# Patient Record
Sex: Male | Born: 1950 | Race: White | Hispanic: No | Marital: Married
Health system: Southern US, Community
[De-identification: ages and names within clinical notes are randomized; demographics above are authoritative.]

---

## 2003-10-23 ENCOUNTER — Ambulatory Visit (HOSPITAL_COMMUNITY): Admission: RE | Admit: 2003-10-23 | Discharge: 2003-10-23 | Payer: Self-pay | Admitting: Family Medicine

## 2006-03-18 ENCOUNTER — Ambulatory Visit (HOSPITAL_COMMUNITY): Admission: RE | Admit: 2006-03-18 | Discharge: 2006-03-18 | Payer: Self-pay | Admitting: Family Medicine

## 2006-04-21 ENCOUNTER — Encounter: Admission: RE | Admit: 2006-04-21 | Discharge: 2006-04-21 | Payer: Self-pay | Admitting: Family Medicine

## 2007-08-06 ENCOUNTER — Ambulatory Visit: Payer: Self-pay | Admitting: Critical Care Medicine

## 2007-08-06 DIAGNOSIS — J449 Chronic obstructive pulmonary disease, unspecified: Secondary | ICD-10-CM

## 2007-08-06 DIAGNOSIS — J309 Allergic rhinitis, unspecified: Secondary | ICD-10-CM | POA: Insufficient documentation

## 2007-08-06 DIAGNOSIS — J45909 Unspecified asthma, uncomplicated: Secondary | ICD-10-CM | POA: Insufficient documentation

## 2007-08-12 ENCOUNTER — Ambulatory Visit: Payer: Self-pay | Admitting: Cardiology

## 2007-08-13 ENCOUNTER — Ambulatory Visit: Payer: Self-pay | Admitting: Critical Care Medicine

## 2007-08-16 ENCOUNTER — Encounter: Payer: Self-pay | Admitting: Critical Care Medicine

## 2007-09-07 ENCOUNTER — Ambulatory Visit: Payer: Self-pay | Admitting: Critical Care Medicine

## 2007-11-29 ENCOUNTER — Ambulatory Visit: Payer: Self-pay | Admitting: Critical Care Medicine

## 2007-11-29 DIAGNOSIS — K219 Gastro-esophageal reflux disease without esophagitis: Secondary | ICD-10-CM

## 2008-01-10 ENCOUNTER — Ambulatory Visit (HOSPITAL_COMMUNITY): Admission: RE | Admit: 2008-01-10 | Discharge: 2008-01-10 | Payer: Self-pay | Admitting: Family Medicine

## 2008-02-29 ENCOUNTER — Ambulatory Visit (HOSPITAL_COMMUNITY): Admission: RE | Admit: 2008-02-29 | Discharge: 2008-02-29 | Payer: Self-pay | Admitting: Family Medicine

## 2008-05-30 ENCOUNTER — Ambulatory Visit (HOSPITAL_COMMUNITY): Admission: RE | Admit: 2008-05-30 | Discharge: 2008-05-30 | Payer: Self-pay | Admitting: General Surgery

## 2008-05-30 ENCOUNTER — Encounter (INDEPENDENT_AMBULATORY_CARE_PROVIDER_SITE_OTHER): Payer: Self-pay | Admitting: General Surgery

## 2008-06-25 ENCOUNTER — Emergency Department (HOSPITAL_COMMUNITY): Admission: EM | Admit: 2008-06-25 | Discharge: 2008-06-25 | Payer: Self-pay | Admitting: Emergency Medicine

## 2008-07-01 ENCOUNTER — Inpatient Hospital Stay (HOSPITAL_COMMUNITY): Admission: EM | Admit: 2008-07-01 | Discharge: 2008-07-04 | Payer: Self-pay | Admitting: Emergency Medicine

## 2008-07-18 ENCOUNTER — Inpatient Hospital Stay (HOSPITAL_COMMUNITY): Admission: RE | Admit: 2008-07-18 | Discharge: 2008-07-20 | Payer: Self-pay | Admitting: Orthopaedic Surgery

## 2008-09-05 ENCOUNTER — Encounter (HOSPITAL_COMMUNITY): Admission: RE | Admit: 2008-09-05 | Discharge: 2008-10-05 | Payer: Self-pay | Admitting: Orthopaedic Surgery

## 2008-10-04 ENCOUNTER — Ambulatory Visit (HOSPITAL_COMMUNITY): Admission: RE | Admit: 2008-10-04 | Discharge: 2008-10-04 | Payer: Self-pay | Admitting: Orthopaedic Surgery

## 2009-05-02 IMAGING — CR DG CHEST 1V PORT
1 series · 1 of 1 positions shown · non-contrast
Comparison: 03/18/2006

CLINICAL DATA: Line placement

PORTABLE CHEST - 1 VIEW

[view not recorded]
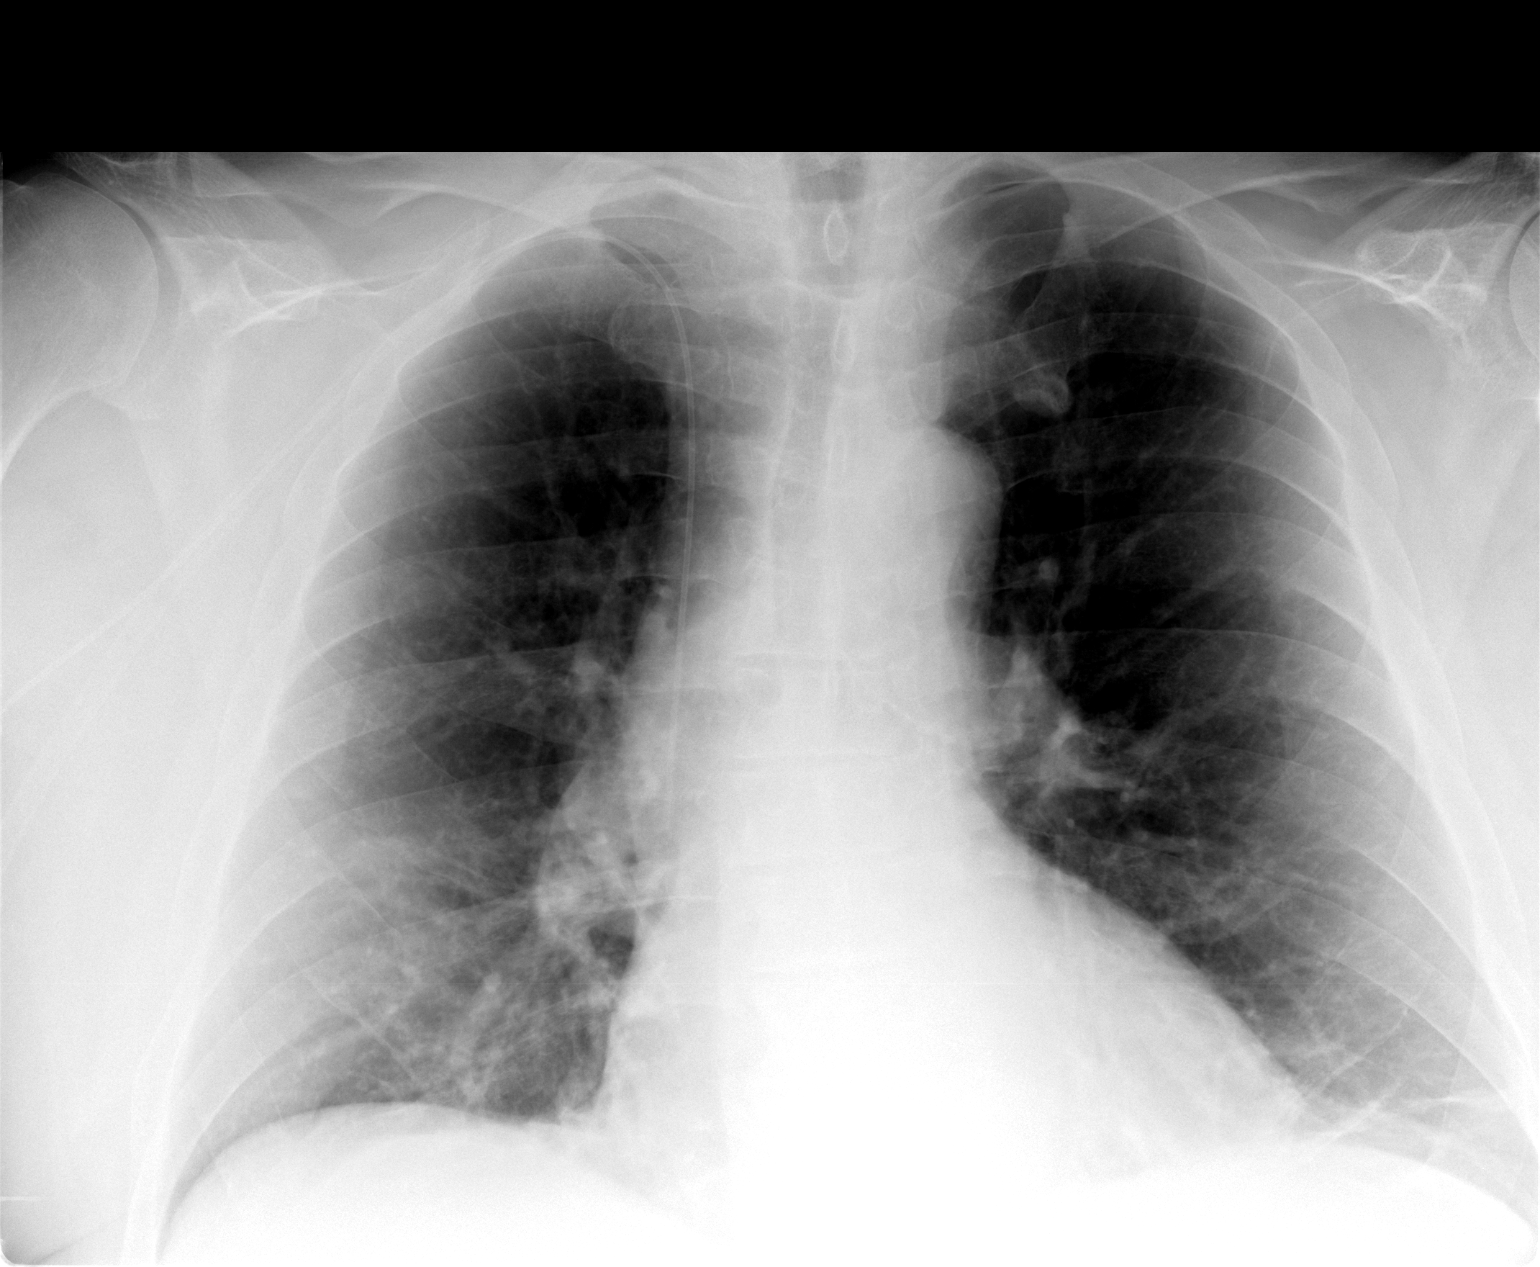

[1 of 1 positions shown; findings below may reference images not displayed]

FINDINGS: A right upper extremity PICC has been placed.  The tip is
at the cavoatrial junction.  Left basilar atelectasis is stable.
Otherwise unchanged exam.
IMPRESSION: Right upper extremity PICC placement with its tip at the cavoatrial
junction.

## 2009-05-25 ENCOUNTER — Ambulatory Visit (HOSPITAL_COMMUNITY): Admission: RE | Admit: 2009-05-25 | Discharge: 2009-05-25 | Payer: Self-pay | Admitting: General Surgery

## 2010-05-01 ENCOUNTER — Inpatient Hospital Stay (HOSPITAL_COMMUNITY): Admission: EM | Admit: 2010-05-01 | Discharge: 2010-05-07 | Payer: Self-pay | Admitting: Emergency Medicine

## 2010-08-27 LAB — DIFFERENTIAL
Eosinophils Absolute: 0 10*3/uL (ref 0.0–0.7)
Eosinophils Absolute: 0 10*3/uL (ref 0.0–0.7)
Eosinophils Relative: 0 % (ref 0–5)
Eosinophils Relative: 0 % (ref 0–5)
Lymphocytes Relative: 5 % — ABNORMAL LOW (ref 12–46)
Lymphocytes Relative: 6 % — ABNORMAL LOW (ref 12–46)
Lymphocytes Relative: 9 % — ABNORMAL LOW (ref 12–46)
Lymphs Abs: 0.4 10*3/uL — ABNORMAL LOW (ref 0.7–4.0)
Lymphs Abs: 0.7 10*3/uL (ref 0.7–4.0)
Lymphs Abs: 1.1 10*3/uL (ref 0.7–4.0)
Monocytes Absolute: 0.3 10*3/uL (ref 0.1–1.0)
Monocytes Relative: 4 % (ref 3–12)
Monocytes Relative: 9 % (ref 3–12)
Neutro Abs: 10.3 10*3/uL — ABNORMAL HIGH (ref 1.7–7.7)
Neutrophils Relative %: 83 % — ABNORMAL HIGH (ref 43–77)
Neutrophils Relative %: 86 % — ABNORMAL HIGH (ref 43–77)

## 2010-08-27 LAB — COMPREHENSIVE METABOLIC PANEL
ALT: 19 U/L (ref 0–53)
AST: 24 U/L (ref 0–37)
AST: 25 U/L (ref 0–37)
Alkaline Phosphatase: 59 U/L (ref 39–117)
BUN: 14 mg/dL (ref 6–23)
CO2: 28 mEq/L (ref 19–32)
CO2: 31 mEq/L (ref 19–32)
Calcium: 9.4 mg/dL (ref 8.4–10.5)
Chloride: 102 mEq/L (ref 96–112)
Creatinine, Ser: 0.94 mg/dL (ref 0.4–1.5)
GFR calc Af Amer: >60 mL/min (ref >60)
GFR calc Af Amer: >60 mL/min (ref >60)
GFR calc non Af Amer: >60 mL/min (ref >60)
GFR calc non Af Amer: >60 mL/min (ref >60)
Glucose, Bld: 116 mg/dL — ABNORMAL HIGH (ref 70–99)
Potassium: 4.3 mEq/L (ref 3.5–5.1)
Sodium: 140 mEq/L (ref 135–145)

## 2010-08-27 LAB — CARDIAC PANEL(CRET KIN+CKTOT+MB+TROPI)
Relative Index: 3.2 — ABNORMAL HIGH (ref 0.0–2.5)
Relative Index: 3.9 — ABNORMAL HIGH (ref 0.0–2.5)
Total CK: 112 U/L (ref 7–232)
Total CK: 135 U/L (ref 7–232)
Troponin I: 0.01 ng/mL (ref 0.00–0.06)
Troponin I: 0.01 ng/mL (ref 0.00–0.06)

## 2010-08-27 LAB — CBC
HCT: 43.6 % (ref 39.0–52.0)
HCT: 44.8 % (ref 39.0–52.0)
Hemoglobin: 14.4 g/dL (ref 13.0–17.0)
Hemoglobin: 15.2 g/dL (ref 13.0–17.0)
Hemoglobin: 15.7 g/dL (ref 13.0–17.0)
MCH: 32 pg (ref 26.0–34.0)
MCH: 33 pg (ref 26.0–34.0)
MCHC: 33.2 g/dL (ref 30.0–36.0)
MCV: 96.3 fL (ref 78.0–100.0)
MCV: 97.2 fL (ref 78.0–100.0)
Platelets: 244 10*3/uL (ref 150–400)
Platelets: 281 10*3/uL (ref 150–400)
RBC: 4.48 MIL/uL (ref 4.22–5.81)
RBC: 4.61 MIL/uL (ref 4.22–5.81)
WBC: 13.3 10*3/uL — ABNORMAL HIGH (ref 4.0–10.5)
WBC: 8.4 10*3/uL (ref 4.0–10.5)

## 2010-08-27 LAB — BASIC METABOLIC PANEL
CO2: 33 mEq/L — ABNORMAL HIGH (ref 19–32)
Chloride: 97 mEq/L (ref 96–112)
GFR calc Af Amer: >60 mL/min (ref >60)
Potassium: 4.4 mEq/L (ref 3.5–5.1)
Sodium: 138 mEq/L (ref 135–145)

## 2010-08-27 LAB — URINALYSIS, ROUTINE W REFLEX MICROSCOPIC
Bilirubin Urine: NEGATIVE
Ketones, ur: NEGATIVE mg/dL
Nitrite: NEGATIVE
Urobilinogen, UA: 0.2 mg/dL (ref 0.0–1.0)

## 2010-08-27 LAB — MRSA PCR SCREENING: MRSA by PCR: NEGATIVE

## 2010-08-27 LAB — LACTIC ACID, PLASMA: Lactic Acid, Venous: 1.1 mmol/L (ref 0.5–2.2)

## 2010-09-30 LAB — COMPREHENSIVE METABOLIC PANEL
Alkaline Phosphatase: 66 U/L (ref 39–117)
BUN: 15 mg/dL (ref 6–23)
Creatinine, Ser: 1 mg/dL (ref 0.4–1.5)
Glucose, Bld: 114 mg/dL — ABNORMAL HIGH (ref 70–99)
Potassium: 4.5 mEq/L (ref 3.5–5.1)
Total Bilirubin: 0.7 mg/dL (ref 0.3–1.2)
Total Protein: 6.7 g/dL (ref 6.0–8.3)

## 2010-09-30 LAB — DIFFERENTIAL
Basophils Absolute: 0 10*3/uL (ref 0.0–0.1)
Basophils Relative: 0 % (ref 0–1)
Basophils Relative: 0 % (ref 0–1)
Eosinophils Relative: 0 % (ref 0–5)
Eosinophils Relative: 0 % (ref 0–5)
Lymphocytes Relative: 10 % — ABNORMAL LOW (ref 12–46)
Lymphocytes Relative: 14 % (ref 12–46)
Lymphocytes Relative: 14 % (ref 12–46)
Lymphs Abs: 1 10*3/uL (ref 0.7–4.0)
Lymphs Abs: 1.1 10*3/uL (ref 0.7–4.0)
Lymphs Abs: 1.2 10*3/uL (ref 0.7–4.0)
Monocytes Absolute: 0.8 10*3/uL (ref 0.1–1.0)
Monocytes Relative: 11 % (ref 3–12)
Monocytes Relative: 13 % — ABNORMAL HIGH (ref 3–12)
Neutro Abs: 5.8 10*3/uL (ref 1.7–7.7)
Neutro Abs: 8.3 10*3/uL — ABNORMAL HIGH (ref 1.7–7.7)
Neutrophils Relative %: 73 % (ref 43–77)
Neutrophils Relative %: 81 % — ABNORMAL HIGH (ref 43–77)

## 2010-09-30 LAB — BASIC METABOLIC PANEL
Calcium: 8.9 mg/dL (ref 8.4–10.5)
Creatinine, Ser: 0.92 mg/dL (ref 0.4–1.5)
GFR calc Af Amer: >60 mL/min (ref >60)
GFR calc non Af Amer: >60 mL/min (ref >60)

## 2010-09-30 LAB — CBC
HCT: 37.2 % — ABNORMAL LOW (ref 39.0–52.0)
HCT: 38 % — ABNORMAL LOW (ref 39.0–52.0)
HCT: 42.8 % (ref 39.0–52.0)
Hemoglobin: 12.3 g/dL — ABNORMAL LOW (ref 13.0–17.0)
Hemoglobin: 14.2 g/dL (ref 13.0–17.0)
MCHC: 33 g/dL (ref 30.0–36.0)
MCV: 96.9 fL (ref 78.0–100.0)
Platelets: 291 10*3/uL (ref 150–400)
RBC: 3.85 MIL/uL — ABNORMAL LOW (ref 4.22–5.81)
RBC: 4.05 MIL/uL — ABNORMAL LOW (ref 4.22–5.81)
RDW: 13 % (ref 11.5–15.5)
RDW: 13.1 % (ref 11.5–15.5)
WBC: 7.6 10*3/uL (ref 4.0–10.5)
WBC: 7.9 10*3/uL (ref 4.0–10.5)
WBC: 8.4 10*3/uL (ref 4.0–10.5)

## 2010-09-30 LAB — URINALYSIS, ROUTINE W REFLEX MICROSCOPIC
Nitrite: NEGATIVE
Protein, ur: NEGATIVE mg/dL
Urobilinogen, UA: 0.2 mg/dL (ref 0.0–1.0)

## 2010-09-30 LAB — VANCOMYCIN, TROUGH: Vancomycin Tr: 8 ug/mL — ABNORMAL LOW (ref 10.0–20.0)

## 2010-09-30 LAB — SEDIMENTATION RATE: Sed Rate: 41 mm/hr — ABNORMAL HIGH (ref 0–16)

## 2010-10-01 LAB — CBC
MCHC: 34.4 g/dL (ref 30.0–36.0)
Platelets: 237 10*3/uL (ref 150–400)
RDW: 13.4 % (ref 11.5–15.5)

## 2010-10-01 LAB — CATH TIP CULTURE: Culture: NO GROWTH

## 2010-10-01 LAB — WOUND CULTURE: Gram Stain: NONE SEEN

## 2010-10-01 LAB — DIFFERENTIAL
Basophils Absolute: 0.1 10*3/uL (ref 0.0–0.1)
Basophils Relative: 1 % (ref 0–1)
Eosinophils Absolute: 0 10*3/uL (ref 0.0–0.7)
Eosinophils Relative: 0 % (ref 0–5)
Lymphocytes Relative: 12 % (ref 12–46)
Lymphs Abs: 0.9 10*3/uL (ref 0.7–4.0)
Monocytes Absolute: 1.1 10*3/uL — ABNORMAL HIGH (ref 0.1–1.0)
Monocytes Relative: 14 % — ABNORMAL HIGH (ref 3–12)
Neutro Abs: 5.9 10*3/uL (ref 1.7–7.7)
Neutrophils Relative %: 74 % (ref 43–77)

## 2010-10-01 LAB — BASIC METABOLIC PANEL WITH GFR
BUN: 8 mg/dL (ref 6–23)
CO2: 30 meq/L (ref 19–32)
Calcium: 8.8 mg/dL (ref 8.4–10.5)
Chloride: 97 meq/L (ref 96–112)
Creatinine, Ser: 0.74 mg/dL (ref 0.4–1.5)
GFR calc non Af Amer: >60 mL/min
Glucose, Bld: 124 mg/dL — ABNORMAL HIGH (ref 70–99)
Potassium: 3.9 meq/L (ref 3.5–5.1)
Sodium: 136 meq/L (ref 135–145)

## 2010-10-01 LAB — URINALYSIS, ROUTINE W REFLEX MICROSCOPIC
Bilirubin Urine: NEGATIVE
Hgb urine dipstick: NEGATIVE
Specific Gravity, Urine: >1.03 — ABNORMAL HIGH (ref 1.005–1.030)
pH: 5.5 (ref 5.0–8.0)

## 2010-10-01 LAB — VANCOMYCIN, TROUGH: Vancomycin Tr: 9 ug/mL — ABNORMAL LOW (ref 10.0–20.0)

## 2010-10-01 LAB — SEDIMENTATION RATE: Sed Rate: 18 mm/h — ABNORMAL HIGH (ref 0–16)

## 2010-10-29 NOTE — Op Note (Signed)
NAME:  Reginald Simon, Reginald Simon                ACCOUNT NO.:  [id number]   MEDICAL RECORD NO.:  [id number]          PATIENT TYPE:  INP   LOCATION:  A339                          FACILITY:  [facility]   PHYSICIAN:  J. Darreld Mclean, M.D. DATE OF BIRTH:  1951/03/05   DATE OF PROCEDURE:  DATE OF DISCHARGE:                               OPERATIVE REPORT   PREOPERATIVE DIAGNOSES:  Fracture left lateral malleolus with deltoid  ligament tear and opening of the mortise.   POSTOPERATIVE DIAGNOSES:  Fracture left lateral malleolus with deltoid  ligament tear and opening of the mortise.   PROCEDURES:  Open treatment of deltoid ligament, primary repair, and  open treatment internal reduction of the left lateral malleolus fracture  using Synthes plate screws.   ANESTHESIA:  Spinal.   TOURNIQUET TIME:  34 minutes.   DRAINS:  None.   Posterior splint applied.   SURGEON:  J. Darreld Mclean, MD   INDICATIONS:  The patient is a 60 year old male who injured his left  ankle several weeks ago.  He developed significant cellulitis and has  been on IV vancomycin, therefore about several days.  The cellulitis has  resolved.  He had a PICC line placed.  The mortise has opened up and the  fracture has moved laterally.  I recommended surgery.  I told him the  possibility of recurrent infection.  I recommended to continue on  vancomycin for another week.  The patient has been on enoxaparin and I  want to continue this after surgery, he stopped yesterday right before  surgery.  I explained the risk of blood clots.  He understands when he  is to use a walker and stay off of this.  Other risks and imponderables  were discussed.  I recommended spinal anesthesia.  He and his wife  agreed to the procedure.   DESCRIPTION OF PROCEDURE:  The patient was taken to holding area.  The  left ankle was identified as correct surgical site.  I placed a mark on  the left great toe.  He was brought to the operating room.  The patient  had spinal anesthesia given and was placed in supine.  Sandbag was  placed under the left hip.  Tourniquet was placed.  He was prepped and  draped in the usual manner.  At a universal time-out, I identified the  patient as Reginald Simon and doing his left ankle.  All instrumentation  deemed to be probably positioned.  Surgical teams know each other, and  all agreed to the patient's identity and the nature of the procedure.   Leg was elevated, wrapped circumferentially with an Esmarch bandage.  Tourniquet inflated to 300 mmHg.  Esmarch bandage was removed.  The  medial incision was made and the deltoid ligament was exposed, it was  torn.  Obtained a culture from the joint, removed small debris, and then  repaired the deltoid ligament with #1 Surgilon suture.  Attention was  directed to the lateral side.  Fracture was identified and exposed.  Fracture was reduced.  A seven hole plate was used.  I used a locking  screw at the second from the top plate and for the very last screw, I  used a cancellous screw.  The other screws were cortical.  Screw sizes  measured from 22 mm to 16 mm.  Locking screw was placed as stated.  X-  rays were taken.  This looked very good.  The fracture was reduced.  The  mortise was reduced.  The wounds were then reapproximated using 2-0  chromic, 2-0 plain, and skin staples on both sides.  Sterile dressing  applied.  Bulky dressing applied.  Posterior splint applied.  Tourniquet  deflated after 34 minutes.  The patient tolerated the procedure well and  went to recovery in good condition.  He is to be admitted.           ______________________________  Shela Commons. Darreld Mclean, M.D.     JWK/MEDQ  D:  07/18/2008  T:  07/19/2008  Job:  [id number]

## 2010-10-29 NOTE — H&P (Signed)
NAME:  Reginald Simon, Reginald Simon                ACCOUNT NO.:  [id number]   MEDICAL RECORD NO.:  [id number]          PATIENT TYPE:  INP   LOCATION:  A337                          FACILITY:  [facility]   PHYSICIAN:  J. Darreld Mclean, M.D. DATE OF BIRTH:  13-Mar-1951   DATE OF ADMISSION:  07/01/2008  DATE OF DISCHARGE:  LH                              HISTORY & PHYSICAL   CHIEF COMPLAINT:  My leg hurts, it is red.   The patient is a 60 year old male who fell this past Sunday, fractured  his left lateral malleolus of his ankle and injured his deltoid ligament  area.  I saw him in the office on Monday.  He had significant swelling,  significant ecchymosis.  I had him return on Thursday.  He had redness  laterally, pain, tenderness was increased, developed cellulitis.  I  placed him on Levaquin.  I told if it got worse to call.  It has gotten  worse and redness is worse and is now extending over to the medial side.   X-RAYS:  X-rays in the office showed some changes of the lateral  malleolus with a slight shift to the lateral side just slightly with  slight opening of the mortise.  I told him that he may need surgery.  Now with the infection, need to have this under control before any  surgery is considered.  He has increased pain, increased tenderness, and  he cannot control the pain at home.  The redness is much worse.  It has  extended since I saw him Thursday.   The patient has no allergies.  He is on albuterol inhaler and Nasonex.  He had history of a pneumonia in the past and history of shortness of  breath and COPD, but denies heart problems, stroke, paralysis, or  weakness.  He has diverticulitis history, but no other GI problems, no  GU problems.  Basically, in good health.  He lives in [place]  by his wife.  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   PHYSICAL EXAMINATION:  GENERAL:  The patient is alert and cooperative.  HEENT:  Negative.  NECK:  Supple.  LUNGS:  Clear to P&A.  HEART:   Regular rate and rhythm.  No murmurs heard.  ABDOMEN:  Soft and nontender without masses.  Abdomen is soft and obese.  No masses felt.  EXTREMITIES:  He has gotten significant redness on the lateral side of  the ankle, extending to the medial side with a large area of marked  tenderness.  He has gotten ecchymoses.  Range of motion of ankle is  decreased, but the edema is down from the other day.  Pulses are  present.  Other extremities are negative.  CNS:  Intact.   IMPRESSION:  1. Significant cellulitis of left lower leg and fractured lateral      malleolus with probable deltoid ligament disruption.  2. History of chronic obstructive pulmonary disease.   PLAN:  Admission, IV vancomycin, pharmaceutical calculated dose.  I will  arrange a PICC line later in a week.  He will need to be on  IV  antibiotics until this resolves and then possible consideration of  surgery on the ankle but have  to make sure the infection is resolved first.  I will begin him on  enoxaparin because he has decreased activity.  I explained this to the  family.  We will have the hospitalist see him as a patient of Dr.  Regino Schultze while he is in the hospital to make sure there is nothing else  that needs to be covered.  Labs are pending.                                            ______________________________  J. Darreld Mclean, M.D.     JWK/MEDQ  D:  07/01/2008  T:  07/01/2008  Job:  [id number]

## 2010-10-29 NOTE — H&P (Signed)
NAME:  Reginald Simon, Reginald Simon NO.:  [id number]   MEDICAL RECORD NO.:  [id number]          PATIENT TYPE:  AMB   LOCATION:  DAY                           FACILITY:  [facility]   PHYSICIAN:  Dalia Heading, M.D.  DATE OF BIRTH:  1951-02-03   DATE OF ADMISSION:  DATE OF DISCHARGE:  LH                              HISTORY & PHYSICAL   CHIEF COMPLAINT:  History of diverticulitis.   HISTORY OF PRESENT ILLNESS:  The patient is a 60 year old white male who  is referred for endoscopic evaluation.  He needs colonoscopy for history  of diverticulitis.  His last episode was documented in July 2009.  He  has intermittent left lower quadrant abdominal pain.  No fever, chills,  nausea, or vomiting have been noted.  He denies any weight loss,  constipation, melena, or hematochezia.  He has never had a colonoscopy.  There is no family history of colon carcinoma.   PAST MEDICAL HISTORY:  COPD.   PAST SURGICAL HISTORY:  Hernia surgery.   CURRENT MEDICATIONS:  Advair, Nasonex, Xopenex.   ALLERGIES:  No known drug allergies.   REVIEW OF SYSTEMS:  Noncontributory.   PHYSICAL EXAMINATION:  GENERAL:  The patient is a well-developed, well-  nourished white male in no acute distress.  LUNGS:  Clear to auscultation with equal breath sounds bilaterally.  HEART:  Regular rate and rhythm without S3, S4, or murmurs.  ABDOMEN:  Soft, nontender, nondistended.  No hepatosplenomegaly or  masses noted.  A reducible umbilical hernia is present.  RECTAL:  Deferred to the procedure.   IMPRESSION:  Diverticulosis/diverticulitis.   PLAN:  The patient is scheduled for a colonoscopy on May 30, 2008.  The risks and benefits of the procedure including bleeding and  perforation were fully explained to the patient, gave informed consent.      Dalia Heading, M.D.  Electronically Signed     MAJ/MEDQ  D:  05/25/2008  T:  05/26/2008  Job:  [id number]   cc:   Patrica Duel, M.D.  Fax: [phone]

## 2010-10-29 NOTE — H&P (Signed)
NAME:  Reginald Simon, Reginald Simon                ACCOUNT NO.:  [id number]   MEDICAL RECORD NO.:  [id number]          PATIENT TYPE:  AMB   LOCATION:  DAY                           FACILITY:  [facility]   PHYSICIAN:  J. Darreld Mclean, M.D. DATE OF BIRTH:  Mar 24, 1951   DATE OF ADMISSION:  DATE OF DISCHARGE:  LH                              HISTORY & PHYSICAL   CHIEF COMPLAINT:  I broke my ankle.   The patient is a 60 year old male who fell and injured his left ankle  earlier in January around the 10th or 11th.  I saw him in the office on  January 11.  Subsequently returned with the cellulitis of his ankle and  I admitted here to the hospital on January 16.  He had placement of an  IV vancomycin PICC line and he has done well now.  Since his fracture  has slipped now more laterally and has obvious deltoid ligament tear and  opening of the mortise, he will need surgery.  Surgery had be delayed  until his cellulitis and infection have cleared up.  The cellulitis is  now resolved. He is doing well with it; the edema is down.  He needs  surgery of the ankle to restore the mortise.  I have gone over the risks  and imponderables of the patient's procedure and he appears to  understand.  He understands infection could come back.  I want to keep  the PICC line going and IV vancomycin at least for a week after surgery.  His pain is gone away.  The swelling is down significantly and he is  feeling much better.   The patient has no known allergies.   Currently he takes albuterol inhaler, Nasonex.  He is on IV vancomycin.  He is on enoxaparin and he will need to stop this prior to surgery.   He is a history of pneumonia in the past, shortness of breath and COPD,  but denies heart problems, stroke, paralysis or weakness.  He has a  history of diverticulitis, but no other GI problems or GU problems.  He  lives in [place] and was accompanied by his wife.  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   VITAL SIGNS:  Normal.   He is afebrile.  HEENT:  Negative.  He is alert, cooperative, oriented.  NECK:  Supple.  LUNGS:  Clear to P and A.  HEART:  Regular rhythm without murmur heard.  ABDOMEN:  Soft, nontender without masses, obese, soft.  He has swelling to the left ankle, but no redness, no erythema.  Range  of motion of the ankle is decreased secondary pain.  Pulses are good.  Other extremities are negative.  CNS:  Intact.  SKIN:  Intact.   1. Fracture left lateral malleolus the deltoid ligament tear left      ankle.  2. Recent cellulitis the left lower extremity, now resolved, currently      on vancomycin IV therapy.  3. COPD.   Plans are to do open treatment, internal reduction of the left ankle  laterally and repair of the  deltoid ligament medially.  Continued IV  antibiotics.  His labs are pending.  Again I have gone over the risks  and imponderables of the procedure.                                            ______________________________  J. Darreld Mclean, M.D.     JWK/MEDQ  D:  07/17/2008  T:  07/17/2008  Job:  [id number]

## 2010-10-29 NOTE — Consult Note (Signed)
NAME:  Reginald Simon, Reginald Simon NO.:  [id number]   MEDICAL RECORD NO.:  [id number]          PATIENT TYPE:  INP   LOCATION:  A337                          FACILITY:  [facility]   PHYSICIAN:  Margaretmary Dys, M.D.DATE OF BIRTH:  1951-05-22   DATE OF CONSULTATION:  07/02/2008  DATE OF DISCHARGE:                                 CONSULTATION   REASON FOR CONSULTATION:  Management of asthma and other medical  problems in the hospital.   HISTORY OF PRESENT ILLNESS:  Reginald Simon is a 60 year old male who was  admitted to the hospital yesterday.  It appears that the patient has a  cellulitis.  Patient, about a week ago, fell and injured his left ankle.  Patient appeared to have fractured his left lateral malleolus of his  ankle and also injured his left deltoid ligament.  Patient was seen in  the office by Dr. Hilda Lias.  Patient has significant swelling and  ecchymosis.  He returned 4 days later after he had some antibiotics.  Patient had some redness and pain, at which patient was then placed on  antibiotics when he returned.  Patient's redness, however, has gotten  worse with extension and over considerable medial side of his leg.  X-  rays of the ankle did show evidence of changes that may require  orthopedic surgery.  Patient has a history of COPD or possibly asthma.  Patient quit smoking many, many years ago and has been having shortness  of breath requiring nebulization/inhaler therapy over the last 15 years.  It shows he has remained stable since admission with no evidence of  sepsis.  He has been placed on intravenous vancomycin.   REVIEW OF SYSTEMS:  As mentioned in history of present illness above.   PAST MEDICAL HISTORY:  1. History of obstructive airway disease, possibly COPD versus asthma.  2. Recent fracture of the left ankle, past surgery.  3. Patient has history of diverticulitis.   CURRENT MEDICATIONS:  Patient has Lovenox 40 mg subcu once a day, Advair  1 puff  inhaled once a day, Flonase 2 sprays nasally daily, morphine PCA  pump, multivitamin 1 tablet p.o. daily, Protonix 40 mg once a day,  vancomycin per pharmacy dosing, asthma Ventolin 2 puffs q.i.d. p.r.n.,  Benadryl as needed, Ambien 10 mg p.o. q.h.s. p.r.n. for sleep.   ALLERGIES:  Patient has no known drug allergies.   FAMILY HISTORY:  Noncontributory.   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  Patient quit smoking about 15 years ago.  Does not drink  alcohol.   PHYSICAL EXAMINATION:  Patient was conscious, alert, comfortable, in no  acute distress, very pleasant, well oriented to time, place, and person.  VITAL SIGNS:  Blood pressure is 123/60, pulse of 75, respirations 20,  temperature 98.2 degrees Fahrenheit, oxygen saturation was 97% on 2 L.  HEENT:  Normocephalic, atraumatic.  Oral mucosa was moist with no  exudates.  NECK:  Supple, no JVD or lymphadenopathy.  LUNGS:  Clear with good air entry bilaterally.  HEART:  S1, S2 regular.  No S3, S4, gallops or rubs.  ABDOMEN:  Soft, nontender.  Bowel sounds positive.  No masses palpable.  EXTREMITIES:  No pedal edema.  No calf induration or tenderness was  noted.  CNS:  Grossly intact with no focal neurological deficits.   LABORATORIES/DIAGNOSTIC DATA:  White blood cell count 7.9, hemoglobin  12.9, hematocrit 39.2, platelet count was 239 with no left shift.  ESR  was 50.  Sodium 135, potassium 4.2, chloride of 96, CO2 was 32, glucose  112, BUN of 12, creatinine was 0.92, calcium 8.9.  Urinalysis was  negative.   Chest x-ray was not obtained during this hospitalization.  X-ray of his  right knee did show no evidence of a joint effusion.   ASSESSMENT/PLAN:  1. Cellulitis of the left leg.  2. History of obstructive airway disease, asthma versus chronic      obstructive pulmonary disease, on chronic inhaler therapy.  3. Fractured left ankle.  Plan per orthopedic surgery.   PLAN:  1.  We will resume patient's home medications at this time.  Patient      reports some itching on the back.  We will give him some emollient      cream.  2. Agree with current IV antibiotic plan per orthopedic surgery.  We      will continue to follow up patient in the hospital with the      orthopedic surgeon.   Thank you very much for this consult.      Margaretmary Dys, M.D.  Electronically Signed     AM/MEDQ  D:  07/02/2008  T:  07/02/2008  Job:  [id number]   cc:   Teola Bradley, M.D.  Fax: [phone]

## 2010-10-29 NOTE — H&P (Signed)
NAME:  Reginald Simon, Reginald Simon NO.:  [id number]   MEDICAL RECORD NO.:  [id number]          PATIENT TYPE:  AMB   LOCATION:  DAY                           FACILITY:  [facility]   PHYSICIAN:  Dalia Heading, M.D.  DATE OF BIRTH:  1950-11-26   DATE OF ADMISSION:  DATE OF DISCHARGE:  LH                              HISTORY & PHYSICAL   CHIEF COMPLAINT:  Diverticulitis.   HISTORY OF PRESENT ILLNESS:  The patient is a 60 year old white male who  is referred for endoscopic evaluation.  He needs colonoscopy for  diverticulitis.  He had an episode documented by CT scan in July 2009.  This was his first episode.  He still has intermittent left lower  quadrant abdominal pain, but no fever, chills, nausea, or vomiting.  He  denies any weight loss, constipation, melena, or hematochezia.  The  patient never had a colonoscopy.  There is no family history of colon  carcinoma.   PAST MEDICAL HISTORY:  COPD.   PAST SURGICAL HISTORY:  Hernia surgery.   CURRENT MEDICATIONS:  __________, Advair, Nasonex.   ALLERGIES:  No known drug allergies.   REVIEW OF SYSTEMS:  The patient smoked about 9 years ago.  He denies any  significant alcohol use.  Denies any other cardiopulmonary difficulties  or bleeding disorders.   PHYSICAL EXAMINATION:  GENERAL:  The patient is a well-developed, well-  nourished white male in no acute distress.  LUNGS:  Clear to auscultation with equal breath sounds bilaterally.  HEART:  Regular rate and rhythm without S3, S4, murmurs.  ABDOMEN:  Soft, nontender, nondistended.  No hepatosplenomegaly or  masses noted.  RECTAL:  Deferred to the procedure.   IMPRESSION:  Diverticulitis.   PLAN:  The patient is scheduled for a colonoscopy on February 29, 2008.  The risks and benefits of the procedure including bleeding and  perforation were fully explained to the patient.  He gave informed  consent.      Dalia Heading, M.D.  Electronically Signed     MAJ/MEDQ   D:  02/22/2008  T:  02/23/2008  Job:  [id number]   cc:   Patrica Duel, M.D.  Fax: [phone]

## 2010-10-29 NOTE — Discharge Summary (Signed)
NAME:  Reginald Simon, Reginald Simon                ACCOUNT NO.:  [id number]   MEDICAL RECORD NO.:  [id number]          PATIENT TYPE:  INP   LOCATION:  A339                          FACILITY:  [facility]   PHYSICIAN:  J. Darreld Mclean, M.D. DATE OF BIRTH:  1950-07-11   DATE OF ADMISSION:  07/18/2008  DATE OF DISCHARGE:  LH                               DISCHARGE SUMMARY   DISCHARGE DIAGNOSES:  Fracture of the left lateral malleolus with  deltoid ligament tear.   PROCEDURE:  Open reduction, internal fixation of left lateral malleolus  fracture and repair of deltoid ligament, collateral ligament primarily.  __________  Improved.   DISPOSITION:  Home.   OTHER DIAGNOSES:  1. Recent cellulitis of the left leg.  Will continue on IV vancomycin.  2. Chronic obstructive pulmonary disease.   DISCHARGE MEDICATIONS:  1. Vancomycin IV, this will be continued per Home Health per the      protocol that he has currently been on now for several weeks.  2. Percocet 7.5/325.  3. Enoxaparin 40 mg subcu daily, this will be continued.  4. Ambien 10 mg as needed at bedtime.  5. Prilosec 20 mg daily.  6. Multivitamins as needed.  7. Nasonex 2 sprays daily.  8. Advair 250/50 daily.  9. Xopenex inhaler as needed.  10.Albuterol inhaler as needed.   BRIEF HISTORY:  The patient had a fracture of his ankle several weeks  ago.  He developed cellulitis.  He has been on IV vancomycin.  The  cellulitis has now resolved and he is for surgery of his ankle because  the fracture has slipped laterally and the mortise is opened medially.  The risks and imponderables of the procedure were discussed  preoperatively.  He underwent the above-mentioned procedure on the day  of admission and tolerated it well.  Postoperatively, his pain was  controlled with a PCA pump of morphine.  He has been seen by physical  therapy and has now been walking well.  He has remained afebrile.  He  has had very little difficulty.  I want to continue his  IV  antibiotics after discharge.  This has been arranged with Home Health.  He is to continue his Lovenox, he understands how to take this.  I will  see him in my office in 1 week on Monday or Tuesday to check the wound.  If any difficulty whatsoever, he is to contact me through the office.                                            ______________________________  J. Darreld Mclean, M.D.     JWK/MEDQ  D:  07/20/2008  T:  07/20/2008  Job:  [id number]

## 2010-11-01 NOTE — Discharge Summary (Signed)
NAME:  Reginald Simon, Reginald Simon                ACCOUNT NO.:  [id number]   MEDICAL RECORD NO.:  [id number]          PATIENT TYPE:  INP   LOCATION:  A337                          FACILITY:  [facility]   PHYSICIAN:  J. Darreld Mclean, M.D. DATE OF BIRTH:  June 09, 1951   DATE OF ADMISSION:  07/01/2008  DATE OF DISCHARGE:  01/19/2010LH                               DISCHARGE SUMMARY   DIAGNOSIS AT DISCHARGE:  Cellulitis of the left lower extremity.   OTHER DIAGNOSES:  1. Fractured left lateral malleolus with suspected deltoid ligament      tear.  2. Chronic obstructive pulmonary disease.   DISCHARGE STATUS:  Improved.   PROGNOSIS:  Good.   DISCHARGE MEDICATIONS:  1. Albuterol inhaler.  2. Xopenex inhaler as needed.  3. Advair 250/500 inhaled daily.  4. Nasonex two sprays both nostrils daily.  5. Percocet 10 mg p.o. q.4h. p.r.n. pain.  6. Enoxaparin 40 mg subcutaneous daily to continue for a total of 1      month.  7. Prilosec 20 mg daily.  8. Multivitamin.  9. Vancomycin daily.  Dose is to be determined by the pharmacist.  10.Percocet 10 q.4h. p.r.n. pain.   FOLLOWUP:  The patient is be seen in my office on Monday following  discharge.   BRIEF HISTORY:  The patient fell this past Sunday prior to admission and  injured his ankle.  I saw him in the office Monday.  He had swelling and  tenderness.  He returned on Thursday.  He had redness around the ankle.  I put him on Levaquin.  They called me early in the morning on the day  of admission, on the 16th.  He had more redness, and it was obvious that  he had a significant cellulitis.  I admitted him to the hospital for IV  vancomycin and placement of a PICC line.  The patient's sed rate was  elevated.  His white count was elevated.  A PICC line was inserted at  the beginning of vancomycin.  Pharmacy saw him and arranged the dosage.  Sed rate on admission was 33; it rose to 50 and went as high as 58  during the hospital stay.  The redness slowly  decreased, and at the time  of  discharge he was having less pain, less tenderness.  The PICC line was  inserted without any difficulty.  Home health was arranged.  He is to  keep his leg elevated.  Any difficulties, he is to contact me.  I will  see him in the office Monday after discharge.                                            ______________________________  J. Darreld Mclean, M.D.     JWK/MEDQ  D:  08/01/2008  T:  08/01/2008  Job:  [id number]

## 2013-05-02 ENCOUNTER — Other Ambulatory Visit (HOSPITAL_COMMUNITY): Payer: Self-pay | Admitting: Family Medicine

## 2013-05-02 ENCOUNTER — Ambulatory Visit (HOSPITAL_COMMUNITY)
Admission: RE | Admit: 2013-05-02 | Discharge: 2013-05-02 | Disposition: A | Discharge status: Home / Self Care | Payer: No Typology Code available for payment source | Source: Ambulatory Visit | Attending: Family Medicine | Admitting: Family Medicine

## 2013-05-02 DIAGNOSIS — R0602 Shortness of breath: Secondary | ICD-10-CM | POA: Insufficient documentation

## 2013-05-02 DIAGNOSIS — J984 Other disorders of lung: Secondary | ICD-10-CM | POA: Insufficient documentation

## 2013-05-02 DIAGNOSIS — J45909 Unspecified asthma, uncomplicated: Secondary | ICD-10-CM

## 2014-02-08 ENCOUNTER — Other Ambulatory Visit (HOSPITAL_COMMUNITY): Payer: Self-pay | Admitting: Internal Medicine

## 2014-02-08 DIAGNOSIS — G459 Transient cerebral ischemic attack, unspecified: Secondary | ICD-10-CM

## 2014-02-09 ENCOUNTER — Other Ambulatory Visit (HOSPITAL_COMMUNITY): Payer: Self-pay | Admitting: Internal Medicine

## 2014-02-09 DIAGNOSIS — G459 Transient cerebral ischemic attack, unspecified: Secondary | ICD-10-CM

## 2014-02-10 ENCOUNTER — Ambulatory Visit (HOSPITAL_COMMUNITY): Admission: RE | Admit: 2014-02-10 | Payer: Self-pay | Source: Ambulatory Visit

## 2014-02-10 ENCOUNTER — Ambulatory Visit (HOSPITAL_COMMUNITY): Payer: Self-pay | Attending: Internal Medicine
